# Patient Record
Sex: Male | Born: 1990 | Race: White | Hispanic: No | Marital: Single | Smoking: Current every day smoker
Health system: Southern US, Community
[De-identification: ages and names within clinical notes are randomized; demographics above are authoritative.]

---
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
## 2008-05-05 IMAGING — CR DG CHEST 2V
1 series · 2 of 2 positions shown · non-contrast
Comparison: none

REASON FOR EXAM: wheezing cough    Mimoza Gj cAre 1
COMMENTS:   LMP: (Male)

PROCEDURE:     DXR - DXR CHEST PA (OR AP) AND LATERAL  - June 10, 2007  [DATE]
RESULT:     Comparison: No available comparison exam.

[Series 1: view not recorded · 0.17mm/px · 2 of 2 slices shown]
[im 1/2]
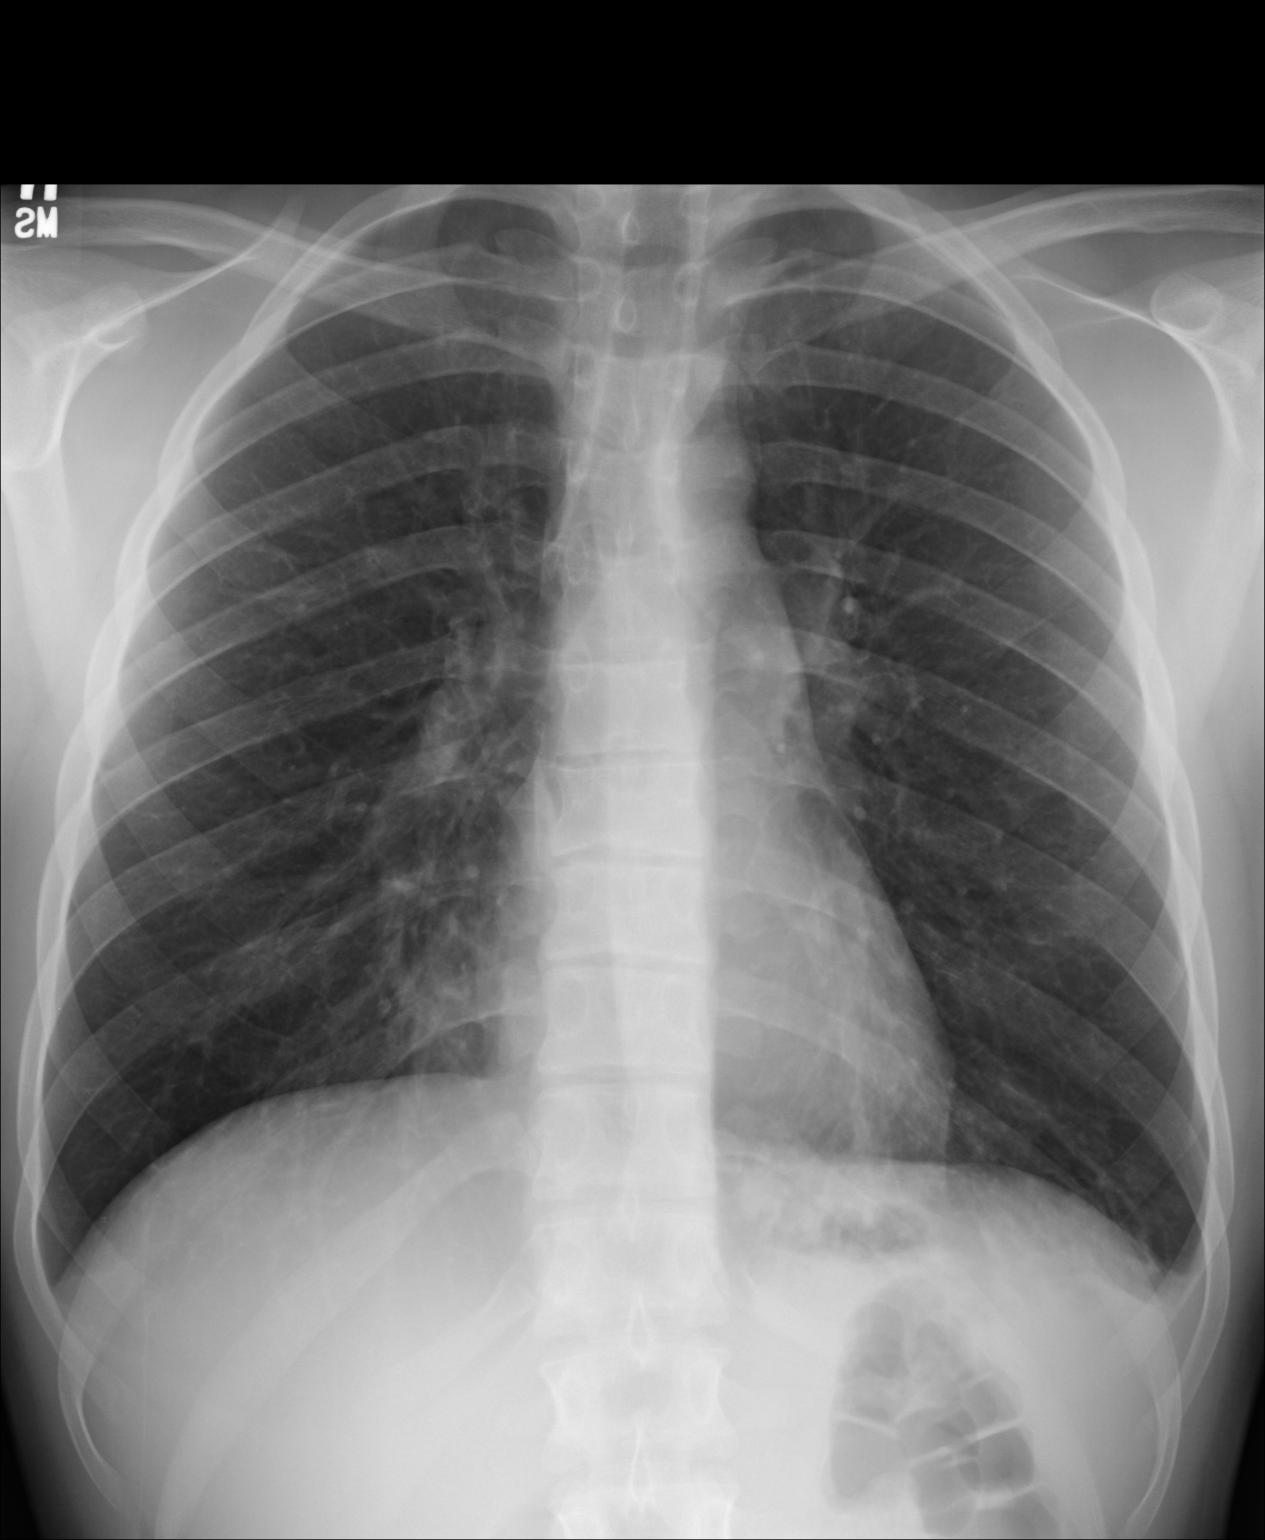
[im 2/2]
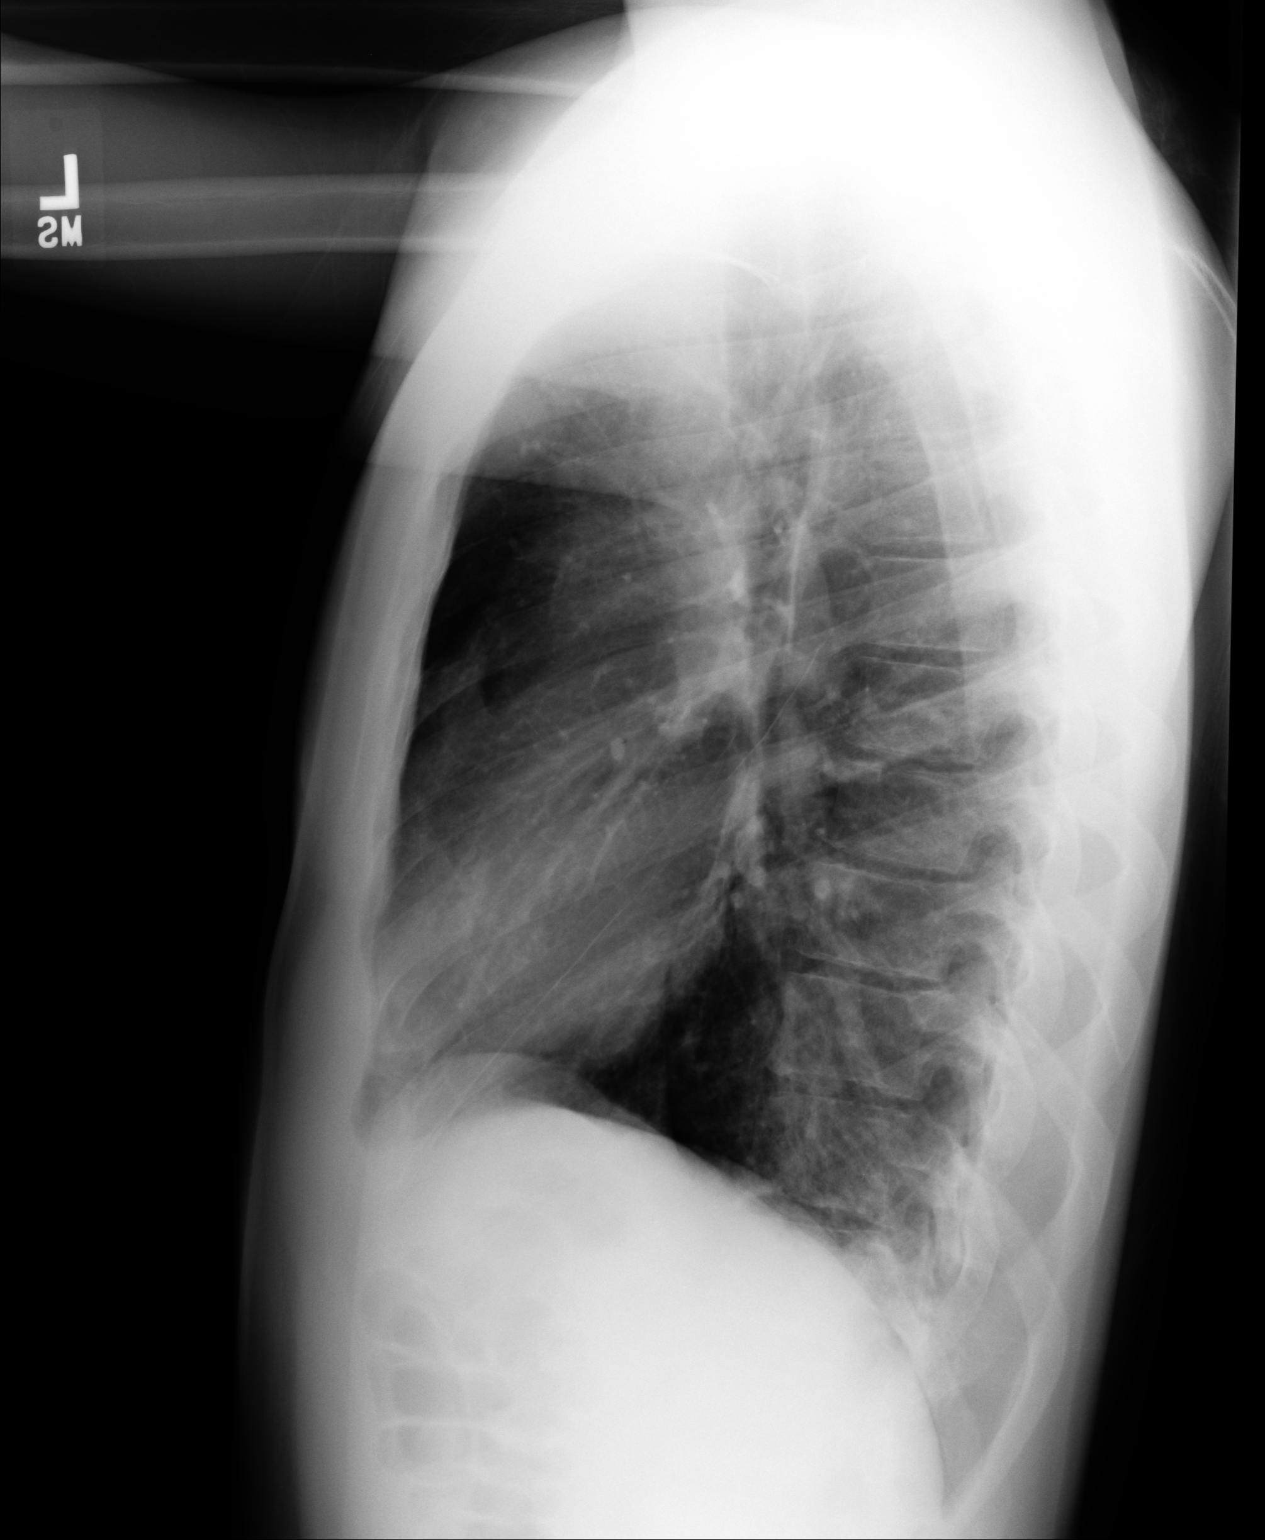

[2 of 2 positions shown; findings below may reference images not displayed]

FINDINGS: Bibasilar air space opacities are noted, concerning for pneumonia. There may
be a small left pleural effusion. There is no significant pulmonary edema
nor pneumothorax. The cardiomediastinal silhouette is within normal limits.
IMPRESSION: 1. Bibasilar air space opacities are noted, concerning for pneumonia. There
may be a small left pleural effusion.

## 2009-07-05 ENCOUNTER — Emergency Department: Payer: Self-pay | Admitting: Emergency Medicine

## 2014-04-10 ENCOUNTER — Emergency Department: Payer: Self-pay | Admitting: Emergency Medicine

## 2014-04-10 LAB — CBC WITH DIFFERENTIAL/PLATELET
BASOS PCT: 0.5 %
Basophil #: 0.1 10*3/uL (ref 0.0–0.1)
EOS PCT: 1.9 %
Eosinophil #: 0.2 10*3/uL (ref 0.0–0.7)
HCT: 44.8 % (ref 40.0–52.0)
HGB: 15 g/dL (ref 13.0–18.0)
Lymphocyte #: 2.7 10*3/uL (ref 1.0–3.6)
Lymphocyte %: 27.2 %
MCH: 31.1 pg (ref 26.0–34.0)
MCHC: 33.4 g/dL (ref 32.0–36.0)
MCV: 93 fL (ref 80–100)
MONO ABS: 0.7 x10 3/mm (ref 0.2–1.0)
Monocyte %: 7.1 %
Neutrophil #: 6.3 10*3/uL (ref 1.4–6.5)
Neutrophil %: 63.3 %
PLATELETS: 210 10*3/uL (ref 150–440)
RBC: 4.81 10*6/uL (ref 4.40–5.90)
RDW: 11.8 % (ref 11.5–14.5)
WBC: 10 10*3/uL (ref 3.8–10.6)

## 2014-04-10 LAB — BASIC METABOLIC PANEL
Anion Gap: 6 — ABNORMAL LOW (ref 7–16)
BUN: 15 mg/dL (ref 7–18)
CALCIUM: 8.1 mg/dL — AB (ref 8.5–10.1)
CHLORIDE: 108 mmol/L — AB (ref 98–107)
CO2: 27 mmol/L (ref 21–32)
Creatinine: 1.06 mg/dL (ref 0.60–1.30)
EGFR (Non-African Amer.): >60
GLUCOSE: 104 mg/dL — AB (ref 65–99)
OSMOLALITY: 282 (ref 275–301)
Potassium: 4.1 mmol/L (ref 3.5–5.1)
SODIUM: 141 mmol/L (ref 136–145)

## 2014-04-10 LAB — LIPASE, BLOOD: Lipase: 124 U/L (ref 73–393)

## 2015-09-01 ENCOUNTER — Emergency Department: Payer: Self-pay

## 2015-09-01 ENCOUNTER — Emergency Department
Admission: EM | Admit: 2015-09-01 | Discharge: 2015-09-01 | Disposition: A | Discharge status: Home / Self Care | Payer: Self-pay | Attending: Emergency Medicine | Admitting: Emergency Medicine

## 2015-09-01 DIAGNOSIS — J111 Influenza due to unidentified influenza virus with other respiratory manifestations: Secondary | ICD-10-CM | POA: Insufficient documentation

## 2015-09-01 DIAGNOSIS — F17200 Nicotine dependence, unspecified, uncomplicated: Secondary | ICD-10-CM | POA: Insufficient documentation

## 2015-09-01 MED ORDER — KETOROLAC TROMETHAMINE 30 MG/ML IJ SOLN
30.0000 mg | Freq: Once | INTRAMUSCULAR | Status: AC
  Administered 2015-09-01: 30 mg via INTRAMUSCULAR

## 2015-09-01 MED ORDER — KETOROLAC TROMETHAMINE 30 MG/ML IJ SOLN
INTRAMUSCULAR | Status: AC
  Filled 2015-09-01: qty 1

## 2015-09-01 NOTE — ED Notes (Signed)
Flu-like symptoms for last 3 days.

## 2015-09-01 NOTE — ED Provider Notes (Signed)
[facility] Emergency Department Provider Note  ____________________________________________  Time seen: On arrival  I have reviewed the triage vital signs and the nursing notes.   HISTORY  Chief Complaint Influenza    HPI Arthur Bennett is a 25 y.o. male who presents with complaints of fever, cough, sore throat, myalgias, fatigue which started approximately 3 days ago. He does report that he had a flu shot this year. He denies shortness of breath. He does smoke cigarettes. No nausea or vomiting. No rash. No sick contacts noted    History reviewed. No pertinent past medical history.  There are no active problems to display for this patient.   History reviewed. No pertinent past surgical history.  No current outpatient prescriptions on file.  Allergies Augmentin  No family history on file.  Social History Social History  Substance Use Topics  . Smoking status: Current Every Day Smoker  . Smokeless tobacco: Never Used  . Alcohol Use: No    Review of Systems  Constitutional: Positive fever Eyes: Negative for redness ENT: Positive for sore throat   Genitourinary: Negative for dysuria. Musculoskeletal: Negative for back pain. Positive for myalgias  Skin: Negative for rash. Neurological: Negative for headaches or focal weakness   ____________________________________________   PHYSICAL EXAM:  VITAL SIGNS: ED Triage Vitals  Enc Vitals Group     BP 09/01/15 1605 116/64 mmHg     Pulse Rate 09/01/15 1605 92     Resp 09/01/15 1605 20     Temp 09/01/15 1605 99.2 F (37.3 C)     Temp Source 09/01/15 1605 Oral     SpO2 09/01/15 1605 97 %     Weight 09/01/15 1605 195 lb (88.451 kg)     Height 09/01/15 1605 6\' 2"  (1.88 m)     Head Cir --      Peak Flow --      Pain Score 09/01/15 1634 8     Pain Loc --      Pain Edu? --      Excl. in GC? --      Constitutional: Alert and oriented. Well appearing and in no distress. Eyes:  Conjunctivae are normal.  ENT   Head: Normocephalic and atraumatic.   Mouth/Throat: Mucous membranes are moist. Pharynx is normal Cardiovascular: Normal rate, regular rhythm.  Respiratory: Normal respiratory effort without tachypnea nor retractions. Clear to auscultation bilaterally Gastrointestinal: Soft and non-tender in all quadrants. No distention. There is no CVA tenderness. Musculoskeletal: Nontender with normal range of motion in all extremities. Neurologic:  Normal speech and language. No gross focal neurologic deficits are appreciated. Skin:  Skin is warm, dry and intact. No rash noted. Psychiatric: Mood and affect are normal. Patient exhibits appropriate insight and judgment.  ____________________________________________    LABS (pertinent positives/negatives)  Labs Reviewed - No data to display  ____________________________________________     ____________________________________________    RADIOLOGY I have personally reviewed any xrays that were ordered on this patient: Chest x-ray unremarkable  ____________________________________________   PROCEDURES  Procedure(s) performed: none   ____________________________________________   INITIAL IMPRESSION / ASSESSMENT AND PLAN / ED COURSE  Pertinent labs & imaging results that were available during my care of the patient were reviewed by me and considered in my medical decision making (see chart for details).  History of present illness most consistent with influenza. Discussed Tamiflu with the patient and he has opted not to use it. Supportive care recommended. Return precautions discussed  ____________________________________________   FINAL  CLINICAL IMPRESSION(S) / ED DIAGNOSES  Final diagnoses:  Influenza     Jene Every, MD 09/01/15 1758

## 2015-09-01 NOTE — Discharge Instructions (Signed)

## 2016-06-28 ENCOUNTER — Emergency Department
Admission: EM | Admit: 2016-06-28 | Discharge: 2016-06-28 | Disposition: A | Discharge status: Home / Self Care | Payer: Self-pay | Attending: Emergency Medicine | Admitting: Emergency Medicine

## 2016-06-28 ENCOUNTER — Encounter: Payer: Self-pay | Admitting: Emergency Medicine

## 2016-06-28 DIAGNOSIS — S0502XA Injury of conjunctiva and corneal abrasion without foreign body, left eye, initial encounter: Secondary | ICD-10-CM | POA: Insufficient documentation

## 2016-06-28 DIAGNOSIS — Y929 Unspecified place or not applicable: Secondary | ICD-10-CM | POA: Insufficient documentation

## 2016-06-28 DIAGNOSIS — X58XXXA Exposure to other specified factors, initial encounter: Secondary | ICD-10-CM | POA: Insufficient documentation

## 2016-06-28 DIAGNOSIS — Y998 Other external cause status: Secondary | ICD-10-CM | POA: Insufficient documentation

## 2016-06-28 DIAGNOSIS — Y9389 Activity, other specified: Secondary | ICD-10-CM | POA: Insufficient documentation

## 2016-06-28 DIAGNOSIS — F17200 Nicotine dependence, unspecified, uncomplicated: Secondary | ICD-10-CM | POA: Insufficient documentation

## 2016-06-28 MED ORDER — EYE WASH OPHTH SOLN
OPHTHALMIC | Status: AC
  Filled 2016-06-28: qty 118

## 2016-06-28 MED ORDER — POLYMYXIN B-TRIMETHOPRIM 10000-0.1 UNIT/ML-% OP SOLN: 1.0000 [drp] | Freq: Four times a day (QID) | OPHTHALMIC | 0 refills | Status: AC

## 2016-06-28 MED ORDER — FLUORESCEIN SODIUM 0.6 MG OP STRP
1.0000 | ORAL_STRIP | Freq: Once | OPHTHALMIC | Status: DC
  Filled 2016-06-28: qty 1

## 2016-06-28 MED ORDER — TETRACAINE HCL 0.5 % OP SOLN
2.0000 [drp] | Freq: Once | OPHTHALMIC | Status: DC
  Filled 2016-06-28: qty 2

## 2016-06-28 NOTE — ED Provider Notes (Signed)
[facility] Emergency Department Provider Note  ____________________________________________  Time seen: Approximately 4:25 PM  I have reviewed the triage vital signs and the nursing notes.   HISTORY  Chief Complaint Eye Pain    HPI Arthur Bennett is a 26 y.o. male, NAD, presents to the Emergency Department for evaluation of 24-hour history of left eye pain. States his young daughter accidentally hit him in the left eye with her hand yesterday. Since that time he has felt like his eye is scratched and his vision has been blurry. The pain has been constant and has not migrated around his eye. He has not taken anything for pain. He is also experiencing light sensitivity and a headache. Denies any drainage, bleeding from the eye. Has had no loss of vision or floaters. No tunnel vision. Denies neck pain or other injuries or traumas to the head or neck. Has had no fevers, chills or body aches. Has had no changes in speech or gait. Denies use of contact lenses nor glasses.   History reviewed. No pertinent past medical history.  There are no active problems to display for this patient.   No past surgical history on file.  Prior to Admission medications   Medication Sig Start Date End Date Taking? Authorizing Provider  trimethoprim-polymyxin b (POLYTRIM) ophthalmic solution Place 1 drop into both eyes every 6 (six) hours. 06/28/16   Jami L Hagler, PA-C    Allergies Augmentin [amoxicillin-pot clavulanate]  No family history on file.  Social History Social History  Substance Use Topics  . Smoking status: Current Every Day Smoker  . Smokeless tobacco: Never Used  . Alcohol use No     Review of Systems  Constitutional: No fever/chills Eyes: Positive left eye pain, redness and blurred vision. No eye drainage, swelling, loss of vision, floaters in vision, tunnel vision ENT: No sore throat or nasal congestion, runny nose. Musculoskeletal: Negative for neck  pain.  Skin: Negative for rash, redness, swelling. Neurological: Positive for headaches, but no focal weakness or numbness. No lightheadedness, dizziness. 10-point ROS otherwise negative.  ____________________________________________   PHYSICAL EXAM:  VITAL SIGNS: ED Triage Vitals  Enc Vitals Group     BP 06/28/16 1602 113/63     Pulse Rate 06/28/16 1602 92     Resp 06/28/16 1602 16     Temp 06/28/16 1602 97.8 F (36.6 C)     Temp Source 06/28/16 1602 Oral     SpO2 06/28/16 1602 97 %     Weight 06/28/16 1603 210 lb (95.3 kg)     Height 06/28/16 1603 6\' 2"  (1.88 m)     Head Circumference --      Peak Flow --      Pain Score 06/28/16 1603 8     Pain Loc --      Pain Edu? --      Excl. in GC? --      Constitutional: Alert and oriented. Well appearing and in no acute distress. Eyes: Left eye with corneal abrasion visible with and without fluorescein stain.No waterfall sign. Visual acuity is noted as R eye 20/15 and L eye 20/50. Left conjunctiva with mild injection. PERRLA. EOMI without pain.  Head: Atraumatic. Neck: Supple with full range of motion. Hematological/Lymphatic/Immunilogical: No cervical lymphadenopathy. Cardiovascular:   Good peripheral circulation. Respiratory: Normal respiratory effort without tachypnea or retractions. Neurologic:  Normal speech and language. No gross focal neurologic deficits are appreciated.  Skin:  Skin is warm, dry and intact. No  rash noted. Psychiatric: Mood and affect are normal. Speech and behavior are normal. Patient exhibits appropriate insight and judgement.   ____________________________________________   LABS  None ____________________________________________  EKG  None ____________________________________________  RADIOLOGY  None ____________________________________________    PROCEDURES  Procedure(s) performed: None   Procedures   Medications  fluorescein ophthalmic strip 1 strip (not administered)   tetracaine (PONTOCAINE) 0.5 % ophthalmic solution 2 drop (not administered)  eye wash ((SODIUM/POTASSIUM/SOD CHLORIDE)) ophthalmic solution (not administered)     ____________________________________________   INITIAL IMPRESSION / ASSESSMENT AND PLAN / ED COURSE  Pertinent labs & imaging results that were available during my care of the patient were reviewed by me and considered in my medical decision making (see chart for details).     Patient's diagnosis is consistent with abrasion of the left cornea. Patient will be discharged home with prescriptions for Polytrim eyedrops to use as directed. May take over-the-counter Tylenol or ibuprofen as needed for pain. Patient is to follow up with Dr. Brooke Dare in ophthalmology if symptoms persist past this treatment course. Patient is given ED precautions to return to the ED for any worsening or new symptoms.   ____________________________________________  FINAL CLINICAL IMPRESSION(S) / ED DIAGNOSES  Final diagnoses:  Abrasion of left cornea, initial encounter      NEW MEDICATIONS STARTED DURING THIS VISIT:  Discharge Medication List as of 06/28/2016  5:45 PM    START taking these medications   Details  trimethoprim-polymyxin b (POLYTRIM) ophthalmic solution Place 1 drop into both eyes every 6 (six) hours., Starting Fri 06/28/2016, Print             Ernestene Kiel Hampton, PA-C 06/28/16 1824    Governor Rooks, MD 06/28/16 2043

## 2016-06-28 NOTE — ED Triage Notes (Signed)
Pt reports daughter scratched left eye while playing today.

## 2016-07-27 IMAGING — CR DG CHEST 2V
1 series · 3 of 3 positions shown · non-contrast
Comparison: April 10, 2007

CLINICAL DATA: Cough and congestion; fever for 3 days

EXAM:
CHEST  2 VIEW

[Series 1: dg chest 2 view · 0.14mm/px · 3 of 3 slices shown]
[im 1/3]
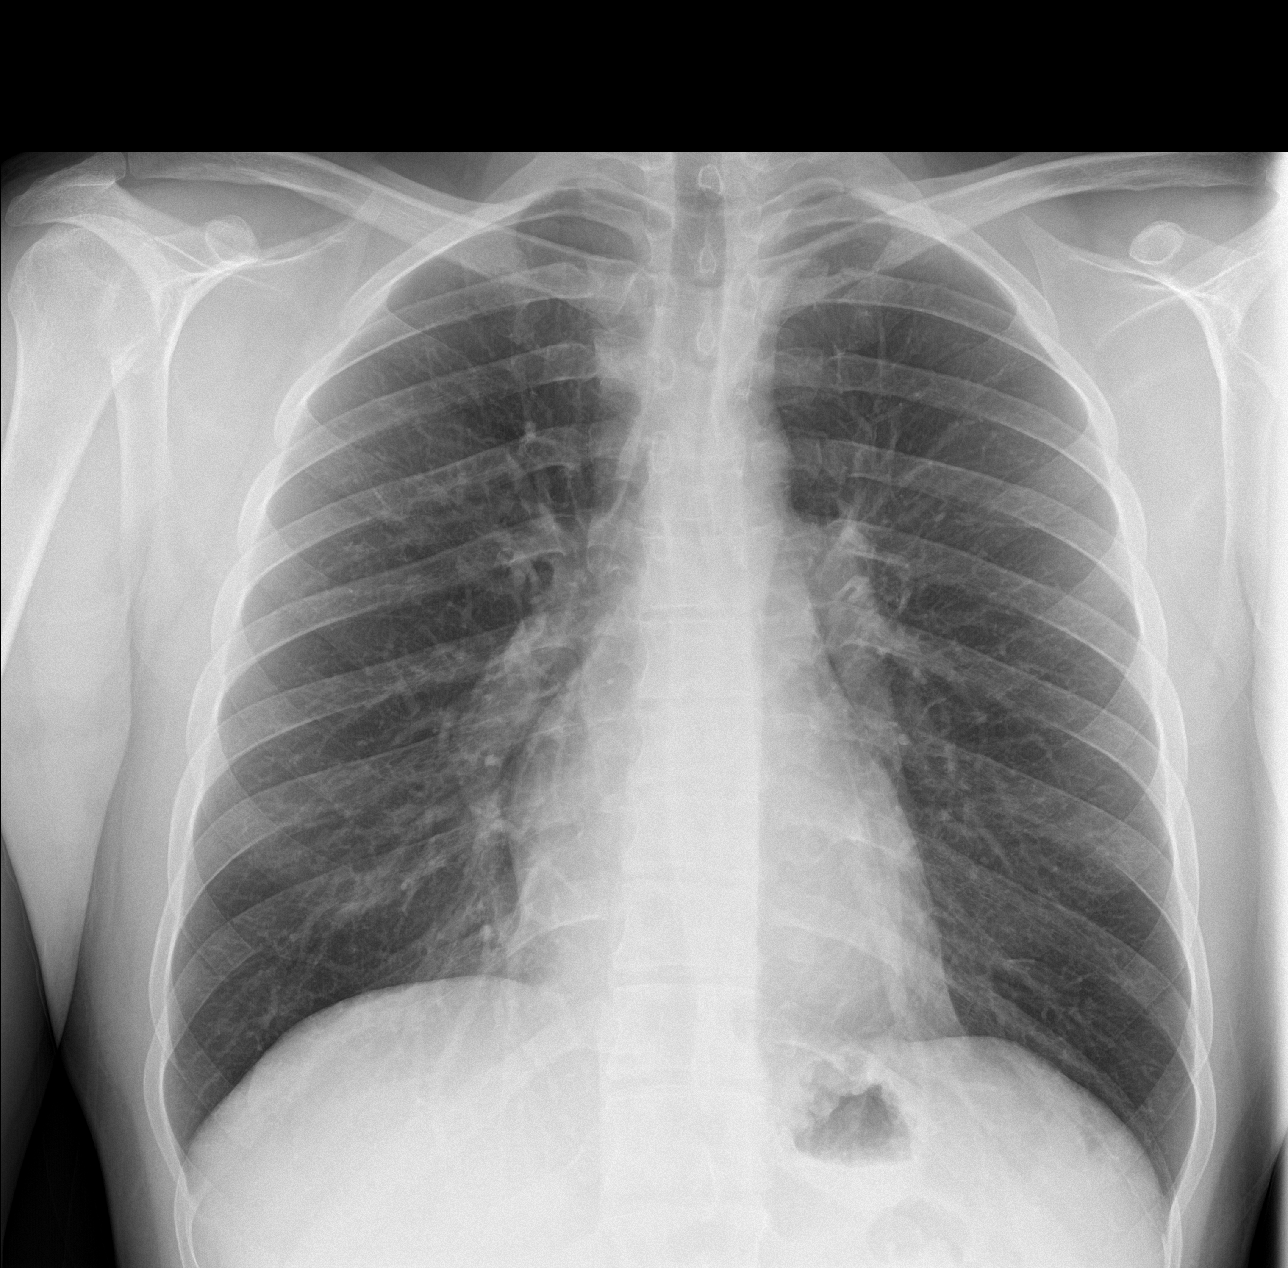
[im 2/3]
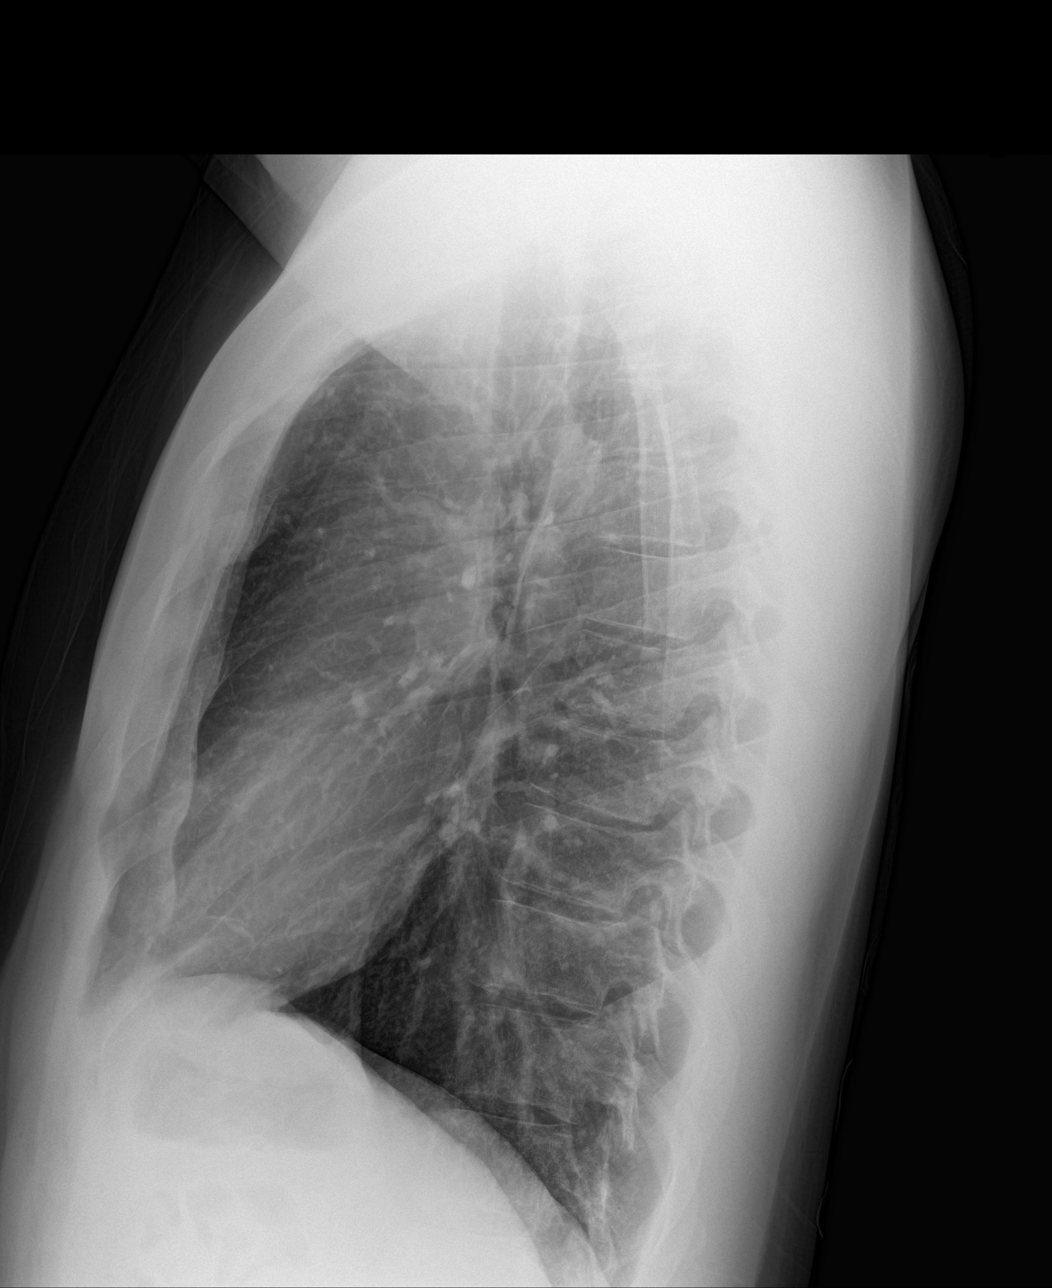
[im 3/3]
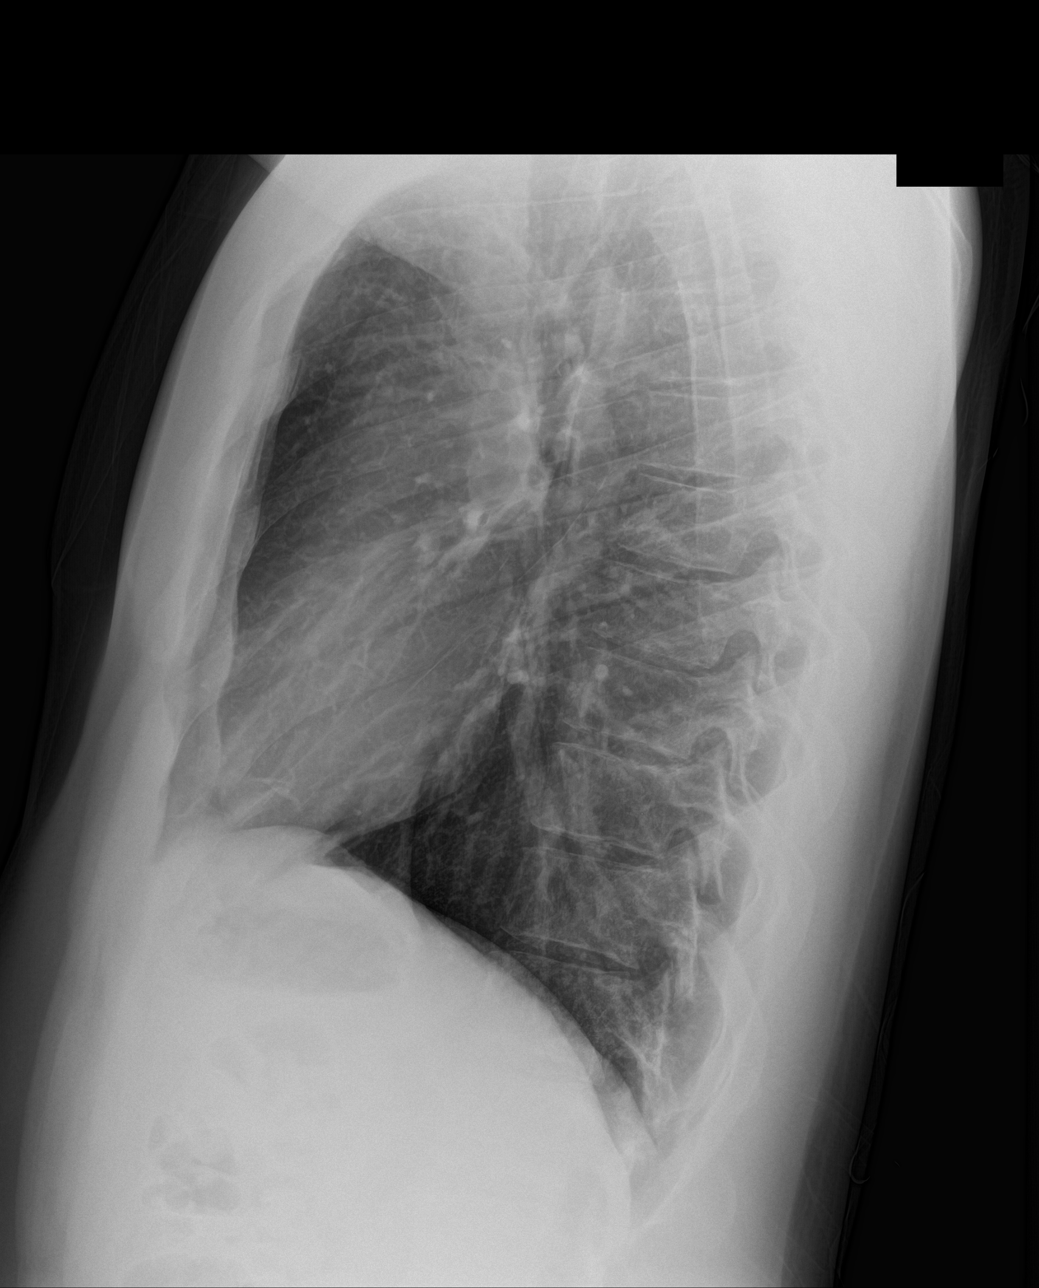

[3 of 3 positions shown; findings below may reference images not displayed]

FINDINGS: Lungs are clear. Heart size and pulmonary vascularity are normal. No
adenopathy. No bone lesions.
IMPRESSION: No edema or consolidation.

## 2018-01-09 ENCOUNTER — Encounter: Payer: Self-pay | Admitting: Emergency Medicine

## 2018-01-09 ENCOUNTER — Emergency Department
Admission: EM | Admit: 2018-01-09 | Discharge: 2018-01-09 | Disposition: A | Discharge status: Home / Self Care | Payer: Self-pay | Attending: Emergency Medicine | Admitting: Emergency Medicine

## 2018-01-09 ENCOUNTER — Other Ambulatory Visit: Payer: Self-pay

## 2018-01-09 DIAGNOSIS — F17210 Nicotine dependence, cigarettes, uncomplicated: Secondary | ICD-10-CM | POA: Insufficient documentation

## 2018-01-09 DIAGNOSIS — K047 Periapical abscess without sinus: Secondary | ICD-10-CM

## 2018-01-09 MED ORDER — CLINDAMYCIN HCL 150 MG PO CAPS
300.0000 mg | ORAL_CAPSULE | Freq: Once | ORAL | Status: AC
  Administered 2018-01-09: 300 mg via ORAL
  Filled 2018-01-09: qty 2

## 2018-01-09 MED ORDER — TRAMADOL HCL 50 MG PO TABS
50.0000 mg | ORAL_TABLET | Freq: Once | ORAL | Status: AC
  Administered 2018-01-09: 50 mg via ORAL
  Filled 2018-01-09: qty 1

## 2018-01-09 MED ORDER — KETOROLAC TROMETHAMINE 30 MG/ML IJ SOLN
30.0000 mg | Freq: Once | INTRAMUSCULAR | Status: AC
  Administered 2018-01-09: 30 mg via INTRAMUSCULAR
  Filled 2018-01-09: qty 1

## 2018-01-09 MED ORDER — KETOROLAC TROMETHAMINE 10 MG PO TABS: 10.0000 mg | ORAL_TABLET | Freq: Four times a day (QID) | ORAL | 0 refills | Status: AC | PRN

## 2018-01-09 MED ORDER — TRAMADOL HCL 50 MG PO TABS: 50.0000 mg | ORAL_TABLET | Freq: Four times a day (QID) | ORAL | 0 refills | Status: AC | PRN

## 2018-01-09 MED ORDER — CLINDAMYCIN HCL 300 MG PO CAPS: 300.0000 mg | ORAL_CAPSULE | Freq: Three times a day (TID) | ORAL | 0 refills | Status: AC

## 2018-01-09 NOTE — ED Provider Notes (Signed)
[facility] Emergency Department Provider Note  ____________________________________________  Time seen: Approximately 8:53 PM  I have reviewed the triage vital signs and the nursing notes.   HISTORY  Chief Complaint Dental Pain    HPI Arthur Bennett is a 27 y.o. male presents to the emergency department with 10 out of 10 left lower jaw pain.  Patient reports that inferior 24 is affected by dental caries and needs to be pulled.  Patient has not made an appointment with a local dentist due to lack of insurance.  Patient has been afebrile.  His pain is worsened with chewing and opening and closing the jaw. Patient has had no difficulty swallowing.  No alleviating measures of been attempted.   History reviewed. No pertinent past medical history.  There are no active problems to display for this patient.   History reviewed. No pertinent surgical history.  Prior to Admission medications   Medication Sig Start Date End Date Taking? Authorizing Provider  clindamycin (CLEOCIN) 300 MG capsule Take 1 capsule (300 mg total) by mouth 3 (three) times daily for 10 days. 01/09/18 01/19/18  Orvil Feil, PA-C  ketorolac (TORADOL) 10 MG tablet Take 1 tablet (10 mg total) by mouth every 6 (six) hours as needed for up to 5 days. 01/09/18 01/14/18  Orvil Feil, PA-C  traMADol (ULTRAM) 50 MG tablet Take 1 tablet (50 mg total) by mouth every 6 (six) hours as needed for up to 3 days. 01/09/18 01/12/18  Orvil Feil, PA-C  trimethoprim-polymyxin b (POLYTRIM) ophthalmic solution Place 1 drop into both eyes every 6 (six) hours. 06/28/16   Hagler, Jami L, PA-C    Allergies Augmentin [amoxicillin-pot clavulanate]  No family history on file.  Social History Social History   Tobacco Use  . Smoking status: Current Every Day Smoker  . Smokeless tobacco: Never Used  Substance Use Topics  . Alcohol use: No  . Drug use: No     Review of Systems  Constitutional: No  fever/chills Eyes: No visual changes. No discharge ENT: Patient has dental pain.  Cardiovascular: no chest pain. Respiratory: no cough. No SOB. Gastrointestinal: No abdominal pain.  No nausea, no vomiting.  No diarrhea.  No constipation. Musculoskeletal: Negative for musculoskeletal pain. Skin: Negative for rash, abrasions, lacerations, ecchymosis. Neurological: Negative for headaches, focal weakness or numbness.  ____________________________________________   PHYSICAL EXAM:  VITAL SIGNS: ED Triage Vitals  Enc Vitals Group     BP 01/09/18 1657 126/79     Pulse Rate 01/09/18 1657 79     Resp 01/09/18 1657 16     Temp 01/09/18 1657 98.7 F (37.1 C)     Temp Source 01/09/18 1657 Oral     SpO2 01/09/18 1657 99 %     Weight 01/09/18 1658 190 lb (86.2 kg)     Height 01/09/18 1658 6\' 2"  (1.88 m)     Head Circumference --      Peak Flow --      Pain Score 01/09/18 1658 7     Pain Loc --      Pain Edu? --      Excl. in GC? --      Constitutional: Alert and oriented. Well appearing and in no acute distress. Eyes: Conjunctivae are normal. PERRL. EOMI. Head: Atraumatic. ENT:      Ears: TMs are pearly.      Nose: No congestion/rhinnorhea.      Mouth/Throat: Mucous membranes are moist.  Patient has dental  caries of inferior 24.  Patient has significant left lower jaw edema.  No pain with palpation under the tongue. Hematological/Lymphatic/Immunilogical: No cervical lymphadenopathy. Cardiovascular: Normal rate, regular rhythm. Normal S1 and S2.  Good peripheral circulation. Respiratory: Normal respiratory effort without tachypnea or retractions. Lungs CTAB. Good air entry to the bases with no decreased or absent breath sounds. Skin:  Skin is warm, dry and intact. No rash noted.  ____________________________________________   LABS (all labs ordered are listed, but only abnormal results are displayed)  Labs Reviewed - No data to  display ____________________________________________  EKG   ____________________________________________  RADIOLOGY  No results found.  ____________________________________________    PROCEDURES  Procedure(s) performed:    Procedures    Medications  ketorolac (TORADOL) 30 MG/ML injection 30 mg (30 mg Intramuscular Given 01/09/18 1931)  traMADol (ULTRAM) tablet 50 mg (50 mg Oral Given 01/09/18 1931)  clindamycin (CLEOCIN) capsule 300 mg (300 mg Oral Given 01/09/18 1931)     ____________________________________________   INITIAL IMPRESSION / ASSESSMENT AND PLAN / ED COURSE  Pertinent labs & imaging results that were available during my care of the patient were reviewed by me and considered in my medical decision making (see chart for details).  Review of the [place] CSRS was performed in accordance of the [facility] prior to dispensing any controlled drugs.      Assessment and Plan:  Dental Abscess:  Patient presents to the emergency department with a dental abscess with dental caries affecting inferior 24.  Patient was treated empirically with clindamycin due to amoxicillin allergy.  He was advised to make an appointment with a local dentist as soon as possible.  Vital signs are reassuring prior to discharge.  All patient questions were answered.   ____________________________________________  FINAL CLINICAL IMPRESSION(S) / ED DIAGNOSES  Final diagnoses:  Dental abscess      NEW MEDICATIONS STARTED DURING THIS VISIT:  ED Discharge Orders        Ordered    clindamycin (CLEOCIN) 300 MG capsule  3 times daily     01/09/18 1928    ketorolac (TORADOL) 10 MG tablet  Every 6 hours PRN     01/09/18 1928    traMADol (ULTRAM) 50 MG tablet  Every 6 hours PRN     01/09/18 1928          This chart was dictated using voice recognition software/Dragon. Despite best efforts to proofread, errors can occur which can change the meaning. Any change was purely unintentional.     Orvil FeilWoods, Jaclyn M, PA-C 01/09/18 [id number]    Sharman CheekStafford, Phillip, MD 01/13/18 2046

## 2018-01-09 NOTE — ED Triage Notes (Signed)
Pt to ED via POV, c/o abscess tooth. Pt is in NAD at this time.

## 2018-01-09 NOTE — Discharge Instructions (Signed)
OPTIONS FOR DENTAL FOLLOW UP CARE ° °[place] Department of Health and Human Services - Local Safety Net Dental Clinics °http://www.ncdhhs.gov/dph/oralhealth/services/safetynetclinics.htm °  °[facility] (336-562-3123) ° °[facility] [place] (919-933-9087) ° °[facility] ([phone] ext [phone]) ° °[facility] ([phone]) ° °[facility] ([phone]) °This clinic caters to the indigent population and is on a lottery system. °Location: °[facility], [address], [place] °Clinic Hours: °Wednesdays from 6pm - 9pm, patients seen by a lottery system. °For dates, call or go to [email or web address].[facility].edu/[facility]/patients/Dental-[facility] °Services: °Cleanings, fillings and simple extractions. °Payment Options: °DENTAL WORK IS FREE OF CHARGE. Bring proof of income or support. °Best way to get seen: °Arrive at 5:15 pm - this is a lottery, NOT first come/first serve, so arriving earlier will not increase your chances of being seen. °  °  °[facility] °[phone] °Select option 1 for emergencies °  °Location: °[facility], [address], [place] °Clinic Hours: °No walk-ins accepted - call the day before to schedule an appointment. °Check in times are 9:30 am and 1:30 pm. °Services: °Simple extractions, temporary fillings, pulpectomy/pulp debridement, uncomplicated abscess drainage. °Payment Options: °PAYMENT IS DUE AT THE TIME OF SERVICE.  Fee is usually $100-200, additional surgical procedures (e.g. abscess drainage) may be extra. °Cash, checks, Visa/MasterCard accepted.  Can file Medicaid if patient is covered for dental - patient should call case worker to check. °No discount for [facility] patients. °Best way to get seen: °MUST call the day before and get onto the schedule. Can usually be seen the next 1-2 days. No walk-ins accepted. °  °  °[facility] °[phone] °   °Location: °[facility], [address], [place] °Clinic Hours: °M, W, Th, F 8am or 1:30pm, Tues 9a or 1:30 - first come/first served. °Services: °Simple extractions, temporary fillings, uncomplicated abscess drainage.  You do not need to be an [place] resident. °Payment Options: °PAYMENT IS DUE AT THE TIME OF SERVICE. °Dental insurance, otherwise sliding scale - bring proof of income or support. °Depending on income and treatment needed, cost is usually $50-200. °Best way to get seen: °Arrive early as it is first come/first served. °  °  °[facility] °[phone] °  °Location: °[address] °Clinic Hours: °Mon-Thu 8a-5p °Services: °Most basic dental services including extractions and fillings. °Payment Options: °PAYMENT IS DUE AT THE TIME OF SERVICE. °Sliding scale, up to 50% off - bring proof if income or support. °Medicaid with dental option accepted. °Best way to get seen: °Call to schedule an appointment, can usually be seen within 2 weeks OR they will try to see walk-ins - show up at 8a or 2p (you may have to wait). °  °  °[facility] °919-245-2435 °[place] RESIDENTS ONLY °  °Location: °[facility], [address], [place], [place] [address] °Clinic Hours: By appointment only. °Monday - Thursday 8am-5pm, Friday 8am-12pm °Services: Cleanings, fillings, extractions. °Payment Options: °PAYMENT IS DUE AT THE TIME OF SERVICE. °Cash, Visa or MasterCard. Sliding scale - $30 minimum per service. °Best way to get seen: °Come in to office, complete packet and make an appointment - need proof of income °or support monies for each household member and proof of [place] residence. °Usually takes about a month to get in. °  °  °[facility] °[phone] °  °Location: °[address]   [place] °Clinic Hours: Walk-in Urgent Care Dental Services are offered Monday-Friday  mornings only. °The numbers of emergencies accepted daily is limited to the number of °providers available. °Maximum 15 - Mondays, Wednesdays & Thursdays °Maximum 10 - Tuesdays & Fridays °Services: °You do not need to be a [place] resident to be seen for a dental emergency. °Emergencies are defined as pain, swelling, abnormal bleeding, or dental trauma. Walkins will receive x-rays if needed. °NOTE: Dental cleaning is not an emergency. °Payment Options: °PAYMENT IS DUE AT THE TIME OF SERVICE. °Minimum co-pay is $40.00 for uninsured patients. °Minimum co-pay is $3.00 for Medicaid with dental coverage. °Dental Insurance is accepted and must be presented at time of visit. °Medicare does not cover dental. °Forms of payment: Cash, credit card, checks. °Best way to get seen: °If not previously registered with the clinic, walk-in dental registration begins at 7:15 am and is on a first come/first serve basis. °If previously registered with the clinic, call to make an appointment. °  °  °[facility] °[phone] °[place] RESIDENTS ONLY °  °Location: °[address], [place], [place] °Clinic Hours: °Mon-Thu 10a-2p °Services: Extractions only! °Payment Options: °FREE (donations accepted) - bring proof of income or support °Best way to get seen: °Call and schedule an appointment OR come at 8am on the 1st Monday of every month (except for holidays) when it is first come/first served. °  °  °[facility] °[phone] °  °Location: °[address], [place] °Clinic Hours: °Friday mornings °Services, Payment Options, Best way to get seen: °Call for info °
